# Patient Record
Sex: Female | Born: 1975 | Race: White | Hispanic: No | Marital: Married | Smoking: Never smoker
Health system: Southern US, Community
[De-identification: ages and names within clinical notes are randomized; demographics above are authoritative.]

## PROBLEM LIST (undated history)

## (undated) DIAGNOSIS — G2581 Restless legs syndrome: Secondary | ICD-10-CM

## (undated) DIAGNOSIS — E039 Hypothyroidism, unspecified: Secondary | ICD-10-CM

## (undated) DIAGNOSIS — O139 Gestational [pregnancy-induced] hypertension without significant proteinuria, unspecified trimester: Secondary | ICD-10-CM

## (undated) HISTORY — PX: OTHER SURGICAL HISTORY: SHX169

---

## 1999-12-30 ENCOUNTER — Other Ambulatory Visit: Admission: RE | Admit: 1999-12-30 | Discharge: 1999-12-30 | Payer: Self-pay | Admitting: Obstetrics and Gynecology

## 2001-08-08 ENCOUNTER — Other Ambulatory Visit: Admission: RE | Admit: 2001-08-08 | Discharge: 2001-08-08 | Payer: Self-pay | Admitting: Obstetrics and Gynecology

## 2002-04-20 ENCOUNTER — Inpatient Hospital Stay (HOSPITAL_COMMUNITY): Admission: AD | Admit: 2002-04-20 | Discharge: 2002-04-24 | Payer: Self-pay | Admitting: Obstetrics and Gynecology

## 2002-05-26 ENCOUNTER — Other Ambulatory Visit: Admission: RE | Admit: 2002-05-26 | Discharge: 2002-05-26 | Payer: Self-pay | Admitting: Obstetrics and Gynecology

## 2003-08-19 ENCOUNTER — Other Ambulatory Visit: Admission: RE | Admit: 2003-08-19 | Discharge: 2003-08-19 | Payer: Self-pay | Admitting: Obstetrics and Gynecology

## 2005-08-15 ENCOUNTER — Other Ambulatory Visit: Admission: RE | Admit: 2005-08-15 | Discharge: 2005-08-15 | Payer: Self-pay | Admitting: Obstetrics and Gynecology

## 2006-04-24 HISTORY — PX: DILATION AND CURETTAGE OF UTERUS: SHX78

## 2010-10-07 LAB — STREP B DNA PROBE
GBS: NEGATIVE
GBS: NEGATIVE

## 2010-10-07 LAB — ANTIBODY SCREEN: Antibody Screen: NEGATIVE

## 2010-10-07 LAB — GC/CHLAMYDIA PROBE AMP, GENITAL: Chlamydia: NEGATIVE

## 2010-10-10 LAB — ABO/RH: RH Type: POSITIVE

## 2010-10-10 LAB — HEPATITIS B SURFACE ANTIGEN: Hepatitis B Surface Ag: NEGATIVE

## 2010-11-17 ENCOUNTER — Other Ambulatory Visit (HOSPITAL_COMMUNITY): Payer: Self-pay | Admitting: Obstetrics and Gynecology

## 2010-11-17 DIAGNOSIS — O2690 Pregnancy related conditions, unspecified, unspecified trimester: Secondary | ICD-10-CM

## 2010-12-08 ENCOUNTER — Encounter (HOSPITAL_COMMUNITY): Payer: Self-pay

## 2010-12-08 ENCOUNTER — Ambulatory Visit (HOSPITAL_COMMUNITY)
Admission: RE | Admit: 2010-12-08 | Discharge: 2010-12-08 | Disposition: A | Discharge status: Home / Self Care | Payer: 59 | Source: Ambulatory Visit | Attending: Obstetrics and Gynecology | Admitting: Obstetrics and Gynecology

## 2010-12-08 VITALS — BP 129/68 | HR 98 | Wt 148.0 lb

## 2010-12-08 DIAGNOSIS — O358XX0 Maternal care for other (suspected) fetal abnormality and damage, not applicable or unspecified: Secondary | ICD-10-CM | POA: Insufficient documentation

## 2010-12-08 DIAGNOSIS — O09299 Supervision of pregnancy with other poor reproductive or obstetric history, unspecified trimester: Secondary | ICD-10-CM | POA: Insufficient documentation

## 2010-12-08 DIAGNOSIS — O2690 Pregnancy related conditions, unspecified, unspecified trimester: Secondary | ICD-10-CM

## 2010-12-08 DIAGNOSIS — Z363 Encounter for antenatal screening for malformations: Secondary | ICD-10-CM | POA: Insufficient documentation

## 2010-12-08 DIAGNOSIS — Z1389 Encounter for screening for other disorder: Secondary | ICD-10-CM | POA: Insufficient documentation

## 2010-12-08 NOTE — Progress Notes (Signed)
Vital signs reviewed; see ultrasound report 

## 2011-04-25 NOTE — L&D Delivery Note (Signed)
Delivery Note At  a viable female by svd  (Presentation  vtx oa ;  ).  APGAR 8/9: , ; weight .   Placenta status: intact with 43 vessel cord:  with the following complications: . none  Anesthesia: Epidural  Episiotomy: none Lacerations: none Suture Repair: na Est. Blood Loss (mL):   Mom to postpartum.  Baby to nursery-stable.  Eragon Hammond S 05/04/2011, 2:50 AM

## 2011-05-02 ENCOUNTER — Inpatient Hospital Stay (HOSPITAL_COMMUNITY)
Admission: AD | Admit: 2011-05-02 | Discharge: 2011-05-05 | DRG: 774 | Disposition: A | Discharge status: Home / Self Care | Payer: 59 | Source: Ambulatory Visit | Attending: Obstetrics and Gynecology | Admitting: Obstetrics and Gynecology

## 2011-05-02 ENCOUNTER — Inpatient Hospital Stay (HOSPITAL_COMMUNITY): Payer: 59

## 2011-05-02 ENCOUNTER — Encounter (HOSPITAL_COMMUNITY): Payer: Self-pay | Admitting: *Deleted

## 2011-05-02 DIAGNOSIS — IMO0002 Reserved for concepts with insufficient information to code with codable children: Principal | ICD-10-CM | POA: Diagnosis present

## 2011-05-02 HISTORY — DX: Hypothyroidism, unspecified: E03.9

## 2011-05-02 HISTORY — DX: Gestational (pregnancy-induced) hypertension without significant proteinuria, unspecified trimester: O13.9

## 2011-05-02 LAB — COMPREHENSIVE METABOLIC PANEL
Alkaline Phosphatase: 265 U/L — ABNORMAL HIGH (ref 39–117)
BUN: 11 mg/dL (ref 6–23)
CO2: 26 mEq/L (ref 19–32)
GFR calc Af Amer: >90 mL/min (ref >90)
GFR calc non Af Amer: >90 mL/min (ref >90)
Glucose, Bld: 95 mg/dL (ref 70–99)
Potassium: 4.2 mEq/L (ref 3.5–5.1)
Total Bilirubin: 0.3 mg/dL (ref 0.3–1.2)
Total Protein: 6.3 g/dL (ref 6.0–8.3)

## 2011-05-02 LAB — CBC
HCT: 37 % (ref 36.0–46.0)
MCV: 89.6 fL (ref 78.0–100.0)
RDW: 13.5 % (ref 11.5–15.5)
WBC: 12.3 10*3/uL — ABNORMAL HIGH (ref 4.0–10.5)

## 2011-05-02 LAB — URIC ACID: Uric Acid, Serum: 3.1 mg/dL (ref 2.4–7.0)

## 2011-05-02 MED ORDER — LACTATED RINGERS IV SOLN
INTRAVENOUS | Status: DC
  Administered 2011-05-02: 23:00:00 via INTRAVENOUS
  Administered 2011-05-03: 300 mL via INTRAVENOUS
  Administered 2011-05-03 – 2011-05-04 (×3): via INTRAVENOUS

## 2011-05-02 MED ORDER — BUTORPHANOL TARTRATE 2 MG/ML IJ SOLN: 1.0000 mg | Freq: Once | INTRAMUSCULAR | Status: AC | PRN

## 2011-05-02 MED ORDER — ACETAMINOPHEN 325 MG PO TABS: 650.0000 mg | ORAL_TABLET | ORAL | Status: DC | PRN

## 2011-05-02 MED ORDER — MISOPROSTOL 25 MCG QUARTER TABLET
25.0000 ug | ORAL_TABLET | ORAL | Status: DC | PRN
  Administered 2011-05-02 (×2): 25 ug via VAGINAL
  Filled 2011-05-02 (×2): qty 0.25

## 2011-05-02 MED ORDER — OXYCODONE-ACETAMINOPHEN 5-325 MG PO TABS: 2.0000 | ORAL_TABLET | ORAL | Status: DC | PRN

## 2011-05-02 MED ORDER — LACTATED RINGERS IV SOLN
500.0000 mL | INTRAVENOUS | Status: DC | PRN
  Administered 2011-05-03: 1000 mL via INTRAVENOUS

## 2011-05-02 MED ORDER — FLEET ENEMA 7-19 GM/118ML RE ENEM: 1.0000 | ENEMA | RECTAL | Status: DC | PRN

## 2011-05-02 MED ORDER — OXYTOCIN BOLUS FROM INFUSION
500.0000 mL | Freq: Once | INTRAVENOUS | Status: DC
  Filled 2011-05-02: qty 500

## 2011-05-02 MED ORDER — TERBUTALINE SULFATE 1 MG/ML IJ SOLN: 0.2500 mg | Freq: Once | INTRAMUSCULAR | Status: AC | PRN

## 2011-05-02 MED ORDER — LIDOCAINE HCL (PF) 1 % IJ SOLN: 30.0000 mL | INTRAMUSCULAR | Status: DC | PRN

## 2011-05-02 MED ORDER — PROMETHAZINE HCL 25 MG/ML IJ SOLN: 12.5000 mg | Freq: Once | INTRAMUSCULAR | Status: AC | PRN

## 2011-05-02 MED ORDER — OXYTOCIN 20 UNITS IN LACTATED RINGERS INFUSION - SIMPLE
1.0000 m[IU]/min | INTRAVENOUS | Status: DC
  Administered 2011-05-03: 2 m[IU]/min via INTRAVENOUS
  Administered 2011-05-04: 22 m[IU]/min via INTRAVENOUS
  Filled 2011-05-02 (×2): qty 1000

## 2011-05-02 MED ORDER — ZOLPIDEM TARTRATE 10 MG PO TABS: 10.0000 mg | ORAL_TABLET | Freq: Every evening | ORAL | Status: DC | PRN

## 2011-05-02 MED ORDER — ONDANSETRON HCL 4 MG/2ML IJ SOLN
4.0000 mg | Freq: Four times a day (QID) | INTRAMUSCULAR | Status: DC | PRN
  Administered 2011-05-03: 4 mg via INTRAVENOUS
  Filled 2011-05-02: qty 2

## 2011-05-02 MED ORDER — CITRIC ACID-SODIUM CITRATE 334-500 MG/5ML PO SOLN: 30.0000 mL | ORAL | Status: DC | PRN

## 2011-05-02 MED ORDER — IBUPROFEN 600 MG PO TABS: 600.0000 mg | ORAL_TABLET | Freq: Four times a day (QID) | ORAL | Status: DC | PRN

## 2011-05-02 NOTE — H&P (Signed)
Beverly Chen, KOT                ACCOUNT NO.:  [id number]  MEDICAL RECORD NO.:  [id number]  LOCATION:  9165                          FACILITY:  [facility]  PHYSICIAN:  Duke Salvia. Marcelle Overlie, M.D.DATE OF BIRTH:  January 31, 1976  DATE OF ADMISSION:  05/02/2011 DATE OF DISCHARGE:                             HISTORY & PHYSICAL   CHIEF COMPLAINT:  Preeclampsia at term.  HISTORY OF PRESENT ILLNESS:  A 36 year old G59, P1-0-1-1, EDD is May 16, 2011, EGA is 38 weeks.  This patient was seen yesterday by Dr. Arelia Sneddon and office with a BP of 152/70, 2+ lower extremity edema.  Cervix was 1 cm, 50% vertex.  NST was reactive with TPPA/I.  He scheduled induction for preeclampsia.  Her GBS was negative.  OBSTETRICAL HISTORY:  Significant for prior pregnancy with __________, she had a D and E at [facility] for termination of that pregnancy.  Her first trimester screen returned normal.  PAST MEDICAL HISTORY:  Please see the [facility] form for detail.  PHYSICAL EXAMINATION:  VITAL SIGNS:  Temp 98.3, blood pressure 153/70. HEENT:  Unremarkable. NECK:  Supple without masses. LUNGS:  Clear. CARDIOVASCULAR:  Regular rate and rhythm without murmurs, rubs, or gallops. BREASTS:  Not examined. ABDOMEN:  Term fundal height.  Fetal heart rate 140.  Cervix was 1, 30% vertex.  Membranes intact. EXTREMITIES:  Edema 3+.  Reflexes 1-2+.  No clonus.  IMPRESSION: 1. A 38-week intrauterine pregnancy. 2. Mild pregnancy-induced hypertension, GBS negative.  PLAN:  Dual stage labor induction per  Dr. Arelia Sneddon.  This procedure including use of Cytotec discussed with her, which she understands and accepts.     Laren Whaling M. Marcelle Overlie, M.D.     RM[facility]/MEDQ  D:  05/02/2011  T:  05/02/2011  Job:  [id number]

## 2011-05-02 NOTE — H&P (Signed)
Beverly Chen  DICTATION # [id number] CSN# [id number]   Meriel Pica, MD 05/02/2011 6:45 PM

## 2011-05-03 ENCOUNTER — Encounter (HOSPITAL_COMMUNITY): Payer: Self-pay | Admitting: Anesthesiology

## 2011-05-03 ENCOUNTER — Inpatient Hospital Stay (HOSPITAL_COMMUNITY): Payer: 59 | Admitting: Anesthesiology

## 2011-05-03 LAB — RUBELLA ANTIBODY, IGM: Rubella: IMMUNE

## 2011-05-03 LAB — CULTURE, BETA STREP (GROUP B ONLY): Organism ID, Bacteria: NEGATIVE

## 2011-05-03 MED ORDER — LIDOCAINE HCL 1.5 % IJ SOLN
INTRAMUSCULAR | Status: DC | PRN
  Administered 2011-05-03: 4 mL via EPIDURAL
  Administered 2011-05-03: 5 mL via EPIDURAL

## 2011-05-03 MED ORDER — EPHEDRINE 5 MG/ML INJ
10.0000 mg | INTRAVENOUS | Status: DC | PRN
  Filled 2011-05-03: qty 4

## 2011-05-03 MED ORDER — NALBUPHINE SYRINGE 5 MG/0.5 ML
10.0000 mg | INJECTION | Freq: Once | INTRAMUSCULAR | Status: AC
  Administered 2011-05-03: 10 mg via INTRAVENOUS
  Filled 2011-05-03: qty 1

## 2011-05-03 MED ORDER — DIPHENHYDRAMINE HCL 50 MG/ML IJ SOLN: 12.5000 mg | INTRAMUSCULAR | Status: DC | PRN

## 2011-05-03 MED ORDER — EPHEDRINE 5 MG/ML INJ: 10.0000 mg | INTRAVENOUS | Status: DC | PRN

## 2011-05-03 MED ORDER — LACTATED RINGERS IV SOLN: 500.0000 mL | Freq: Once | INTRAVENOUS | Status: DC

## 2011-05-03 MED ORDER — PHENYLEPHRINE 40 MCG/ML (10ML) SYRINGE FOR IV PUSH (FOR BLOOD PRESSURE SUPPORT): 80.0000 ug | PREFILLED_SYRINGE | INTRAVENOUS | Status: DC | PRN

## 2011-05-03 MED ORDER — FENTANYL 2.5 MCG/ML BUPIVACAINE 1/10 % EPIDURAL INFUSION (WH - ANES)
14.0000 mL/h | INTRAMUSCULAR | Status: DC
  Administered 2011-05-03 – 2011-05-04 (×2): 14 mL/h via EPIDURAL
  Filled 2011-05-03 (×3): qty 60

## 2011-05-03 MED ORDER — PHENYLEPHRINE 40 MCG/ML (10ML) SYRINGE FOR IV PUSH (FOR BLOOD PRESSURE SUPPORT)
80.0000 ug | PREFILLED_SYRINGE | INTRAVENOUS | Status: DC | PRN
  Filled 2011-05-03: qty 5

## 2011-05-03 MED ORDER — LEVOTHYROXINE SODIUM 75 MCG PO TABS
75.0000 ug | ORAL_TABLET | Freq: Every day | ORAL | Status: DC
  Filled 2011-05-03: qty 1

## 2011-05-03 MED ORDER — FENTANYL 2.5 MCG/ML BUPIVACAINE 1/10 % EPIDURAL INFUSION (WH - ANES)
INTRAMUSCULAR | Status: DC | PRN
  Administered 2011-05-03: 14 mL/h via EPIDURAL

## 2011-05-03 NOTE — Anesthesia Procedure Notes (Signed)
Epidural Patient location during procedure: OB Start time: 05/03/2011 6:35 PM  Staffing Anesthesiologist: Karmon Andis A. Performed by: anesthesiologist   Preanesthetic Checklist Completed: patient identified, site marked, surgical consent, pre-op evaluation, timeout performed, IV checked, risks and benefits discussed and monitors and equipment checked  Epidural Patient position: sitting Prep: site prepped and draped and DuraPrep Patient monitoring: continuous pulse ox and blood pressure Approach: midline Injection technique: LOR air  Needle:  Needle type: Tuohy  Needle gauge: 17 G Needle length: 9 cm Needle insertion depth: 5 cm cm Catheter type: closed end flexible Catheter size: 19 Gauge Catheter at skin depth: 10 cm Test dose: negative and 1.5% lidocaine  Assessment Events: blood not aspirated, injection not painful, no injection resistance, negative IV test and no paresthesia  Additional Notes Patient is more comfortable after epidural dosed. Please see RN's note for documentation of vital signs and FHR which are stable.

## 2011-05-03 NOTE — Progress Notes (Signed)
Patient ID: Beverly Chen, female   DOB: Sep 10, 1975, 36 y.o.   MRN: [id number] Epidural placed cx 3+ 50 % vtx -1 fhr 140's no decels Placed iupc Will adjust pitocin  With iupc

## 2011-05-03 NOTE — Anesthesia Preprocedure Evaluation (Addendum)
Anesthesia Evaluation  Patient identified by MRN, date of birth, ID band Patient awake    Reviewed: Allergy & Precautions, H&P , Patient's Chart, lab work & pertinent test results  Airway Mallampati: II TM Distance: >3 FB Neck ROM: full    Dental No notable dental hx. (+) Teeth Intact   Pulmonary neg pulmonary ROS,  clear to auscultation  Pulmonary exam normal       Cardiovascular hypertension, neg cardio ROS regular Normal PIH   Neuro/Psych Negative Neurological ROS  Negative Psych ROS   GI/Hepatic negative GI ROS, Neg liver ROS,   Endo/Other  Hypothyroidism   Renal/GU negative Renal ROS  Genitourinary negative   Musculoskeletal   Abdominal Normal abdominal exam  (+)   Peds  Hematology negative hematology ROS (+)   Anesthesia Other Findings   Reproductive/Obstetrics (+) Pregnancy                          Anesthesia Physical Anesthesia Plan  ASA: II  Anesthesia Plan: Epidural   Post-op Pain Management:    Induction:   Airway Management Planned:   Additional Equipment:   Intra-op Plan:   Post-operative Plan:   Informed Consent: I have reviewed the patients History and Physical, chart, labs and discussed the procedure including the risks, benefits and alternatives for the proposed anesthesia with the patient or authorized representative who has indicated his/her understanding and acceptance.     Plan Discussed with: Anesthesiologist and Surgeon  Anesthesia Plan Comments:         Anesthesia Quick Evaluation

## 2011-05-03 NOTE — Progress Notes (Signed)
Patient ID: Beverly Chen, female   DOB: 1975/09/11, 36 y.o.   MRN: [id number] IUP at 38 admitted with pih cytotec lst pm bp 114/70 vss cx 2 50 % vtx -1 fhr reactive no decel Continue pitocin risks discussed

## 2011-05-03 NOTE — Progress Notes (Signed)
Patient ID: Beverly Chen, female   DOB: Sep 04, 1975, 36 y.o.   MRN: [id number] cx 2 + arom clear pitocin 10 mu fhr reactive no decel

## 2011-05-04 ENCOUNTER — Encounter (HOSPITAL_COMMUNITY): Payer: Self-pay | Admitting: *Deleted

## 2011-05-04 ENCOUNTER — Other Ambulatory Visit: Payer: Self-pay | Admitting: Obstetrics and Gynecology

## 2011-05-04 MED ORDER — DIPHENHYDRAMINE HCL 25 MG PO CAPS: 25.0000 mg | ORAL_CAPSULE | Freq: Four times a day (QID) | ORAL | Status: DC | PRN

## 2011-05-04 MED ORDER — BENZOCAINE-MENTHOL 20-0.5 % EX AERO: 1.0000 "application " | INHALATION_SPRAY | CUTANEOUS | Status: DC | PRN

## 2011-05-04 MED ORDER — SENNOSIDES-DOCUSATE SODIUM 8.6-50 MG PO TABS: 2.0000 | ORAL_TABLET | Freq: Every day | ORAL | Status: DC

## 2011-05-04 MED ORDER — LEVOTHYROXINE SODIUM 75 MCG PO TABS
75.0000 ug | ORAL_TABLET | Freq: Every day | ORAL | Status: DC
  Administered 2011-05-04 – 2011-05-05 (×2): 75 ug via ORAL
  Filled 2011-05-04 (×2): qty 1

## 2011-05-04 MED ORDER — TETANUS-DIPHTH-ACELL PERTUSSIS 5-2.5-18.5 LF-MCG/0.5 IM SUSP: 0.5000 mL | Freq: Once | INTRAMUSCULAR | Status: DC

## 2011-05-04 MED ORDER — LANOLIN HYDROUS EX OINT: TOPICAL_OINTMENT | CUTANEOUS | Status: DC | PRN

## 2011-05-04 MED ORDER — SIMETHICONE 80 MG PO CHEW: 80.0000 mg | CHEWABLE_TABLET | ORAL | Status: DC | PRN

## 2011-05-04 MED ORDER — IBUPROFEN 100 MG/5ML PO SUSP
600.0000 mg | Freq: Four times a day (QID) | ORAL | Status: DC
  Administered 2011-05-04 – 2011-05-05 (×3): 600 mg via ORAL
  Filled 2011-05-04 (×6): qty 30

## 2011-05-04 MED ORDER — FLEET ENEMA 7-19 GM/118ML RE ENEM: 1.0000 | ENEMA | Freq: Every day | RECTAL | Status: DC | PRN

## 2011-05-04 MED ORDER — ZOLPIDEM TARTRATE 5 MG PO TABS: 5.0000 mg | ORAL_TABLET | Freq: Every evening | ORAL | Status: DC | PRN

## 2011-05-04 MED ORDER — DIBUCAINE 1 % RE OINT: 1.0000 "application " | TOPICAL_OINTMENT | RECTAL | Status: DC | PRN

## 2011-05-04 MED ORDER — WITCH HAZEL-GLYCERIN EX PADS: 1.0000 "application " | MEDICATED_PAD | CUTANEOUS | Status: DC | PRN

## 2011-05-04 MED ORDER — PRENATAL MULTIVITAMIN CH: 1.0000 | ORAL_TABLET | Freq: Every day | ORAL | Status: DC

## 2011-05-04 MED ORDER — BISACODYL 10 MG RE SUPP: 10.0000 mg | Freq: Every day | RECTAL | Status: DC | PRN

## 2011-05-04 MED ORDER — OXYCODONE-ACETAMINOPHEN 5-325 MG PO TABS: 1.0000 | ORAL_TABLET | ORAL | Status: DC | PRN

## 2011-05-04 MED ORDER — ONDANSETRON HCL 4 MG/2ML IJ SOLN: 4.0000 mg | INTRAMUSCULAR | Status: DC | PRN

## 2011-05-04 MED ORDER — COMPLETENATE 29-1 MG PO CHEW
1.0000 | CHEWABLE_TABLET | Freq: Every day | ORAL | Status: DC
  Administered 2011-05-04: 1 via ORAL
  Filled 2011-05-04 (×3): qty 1

## 2011-05-04 MED ORDER — ONDANSETRON HCL 4 MG PO TABS: 4.0000 mg | ORAL_TABLET | ORAL | Status: DC | PRN

## 2011-05-04 NOTE — Progress Notes (Signed)
Post Partum Day 0 Subjective: no complaints, up ad lib, voiding and tolerating PO  Objective: Blood pressure 122/66, pulse 80, temperature 98.6 F (37 C), temperature source Oral, resp. rate 18, weight 67.132 kg (148 lb), last menstrual period 08/09/2010, SpO2 97.00%, unknown if currently breastfeeding.  Physical Exam:  General: alert and cooperative Lochia: appropriate Uterine Fundus: firm Perineum intact DVT Evaluation: No evidence of DVT seen on physical exam.   Basename 05/02/11 1850  HGB 12.8  HCT 37.0    Assessment/Plan: Plan for discharge tomorrow   LOS: 2 days   Beverly Chen G 05/04/2011, 8:20 AM

## 2011-05-04 NOTE — Anesthesia Postprocedure Evaluation (Signed)
  Anesthesia Post-op Note  Patient: Beverly Chen  Procedure(s) Performed: * No procedures listed *  Patient Location: PACU and Mother/Baby  Anesthesia Type: Epidural  Level of Consciousness: awake, alert  and oriented  Airway and Oxygen Therapy: Patient Spontanous Breathing  Post-op Pain: none  Post-op Assessment: Post-op Vital signs reviewed and Patient's Cardiovascular Status Stable  Post-op Vital Signs: Reviewed and stable  Complications: No apparent anesthesia complications

## 2011-05-04 NOTE — Progress Notes (Signed)
SW consult received for "babies who have drug screen sent." SW reviewed babies chart and there has not been any drug screens ordered.  Therefore, SW has screened out this referral as an error.  SW will only see if appropriate consult is ordered. 

## 2011-05-05 LAB — CBC
MCH: 30.2 pg (ref 26.0–34.0)
MCHC: 33.1 g/dL (ref 30.0–36.0)
MCV: 91.2 fL (ref 78.0–100.0)
Platelets: 248 10*3/uL (ref 150–400)
RBC: 3.98 MIL/uL (ref 3.87–5.11)
RDW: 13.9 % (ref 11.5–15.5)

## 2011-05-05 NOTE — Progress Notes (Signed)
Post Partum Day 1 Subjective: no complaints, up ad lib, voiding, tolerating PO and + flatus  Objective: Blood pressure 131/82, pulse 62, temperature 97.7 F (36.5 C), temperature source Oral, resp. rate 20, weight 67.132 kg (148 lb), last menstrual period 08/09/2010, SpO2 97.00%, unknown if currently breastfeeding.  Physical Exam:  General: alert and cooperative Lochia: appropriate Uterine Fundus: firm Perineum intact DVT Evaluation: No evidence of DVT seen on physical exam.   Basename 05/05/11 0555 05/02/11 1850  HGB 12.0 12.8  HCT 36.3 37.0    Assessment/Plan: Discharge home   LOS: 3 days   Thomas Mabry G 05/05/2011, 8:03 AM

## 2011-05-05 NOTE — Discharge Summary (Signed)
Obstetric Discharge Summary Reason for Admission: induction of labor Prenatal Procedures: ultrasound Intrapartum Procedures: spontaneous vaginal delivery Postpartum Procedures: none Complications-Operative and Postpartum: none Hemoglobin  Date Value Range Status  05/05/2011 12.0  12.0-15.0 (g/dL) Final     HCT  Date Value Range Status  05/05/2011 36.3  36.0-46.0 (%) Final    Discharge Diagnoses: Term Pregnancy-delivered  Discharge Information: Date: 05/05/2011 Activity: pelvic rest Diet: routine Medications: PNV and synthroid Condition: stable Instructions: refer to practice specific booklet Discharge to: home   Newborn Data: Live born female  Birth Weight: 7 lb 1 oz (3204 g) APGAR: , 10  Home with mother.  Beverly Chen G 05/05/2011, 8:09 AM

## 2011-06-07 ENCOUNTER — Encounter (HOSPITAL_COMMUNITY): Payer: Self-pay | Admitting: Pharmacist

## 2011-06-12 NOTE — H&P (Signed)
  Patient name  Beverly Chen, Older DICTATION#  [id number] CSN# [id number]  [name], MD 06/12/2011 2:00 PM

## 2011-06-13 ENCOUNTER — Encounter (HOSPITAL_COMMUNITY): Payer: Self-pay

## 2011-06-13 ENCOUNTER — Encounter (HOSPITAL_COMMUNITY)
Admission: RE | Admit: 2011-06-13 | Discharge: 2011-06-13 | Disposition: A | Discharge status: Home / Self Care | Payer: 59 | Source: Ambulatory Visit | Attending: Obstetrics and Gynecology | Admitting: Obstetrics and Gynecology

## 2011-06-13 HISTORY — DX: Restless legs syndrome: G25.81

## 2011-06-13 LAB — CBC
HCT: 40.5 % (ref 36.0–46.0)
MCV: 88.4 fL (ref 78.0–100.0)
Platelets: 362 10*3/uL (ref 150–400)
RBC: 4.58 MIL/uL (ref 3.87–5.11)
WBC: 7.4 10*3/uL (ref 4.0–10.5)

## 2011-06-13 NOTE — H&P (Signed)
NAMECHEMERE, STEFFLER NO.:  [id number]  MEDICAL RECORD NO.:  [id number]  LOCATION:  [facility]IO                         FACILITY:  [facility]  PHYSICIAN:  [name], M.D.   DATE OF BIRTH:  1975/05/16  DATE OF ADMISSION:  06/07/2011 DATE OF DISCHARGE:                             HISTORY & PHYSICAL   The patient is a 36 year old, gravida 3, para 2 female, presents for laparoscopic bilateral tubal ligation.  The patient desires a permanent sterilization.  Alternative forms of birth control have been discussed.  The potential irreversibility of sterilization is explained.  A failure rate of 1 in 200 is quoted. Failures can be in the form of ectopic pregnancy requiring further surgical management.  ALLERGIES:  She has no known drug allergies.  MEDICATIONS:  She is on 75 mcg of Synthroid.  PAST MEDICAL HISTORY:  There is a history of hypothyroidism, on medications as noted.  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  PAST SURGICAL HISTORY:  She has had a D and E.  She has had 2 vaginal deliveries.  SOCIAL HISTORY:  No tobacco, alcohol use.  FAMILY HISTORY:  History of diabetes in her mother and father.  Mother also has a history of chronic hypertension.  Paternal grandfather and paternal grandmother had a history of a stroke.  REVIEW OF SYSTEMS:  Noncontributory.  PHYSICAL EXAM:  GENERAL AND VITAL SIGNS:  The patient is afebrile, stable vital signs. HEENT:  The patient is normocephalic.  Pupil equal, round, and reactive to light and accommodation.  Extraocular movements were intact.  Sclerae and conjunctivae were clear.  Oropharynx clear. NECK:  Without thyromegaly. BREASTS:  Not examined. LUNGS:  Clear. CARDIOVASCULAR:  Regular rhythm and rate.  There were no murmurs or gallops. ABDOMEN:  Benign.  No mass, organomegaly, or tenderness. PELVIC:  Normal external genitalia.  Vaginal mucosa is clear.  Cervix unremarkable.  Uterus normal  size, shape, and contour.  Adnexa free of mass or tenderness. EXTREMITIES:  Trace edema. NEUROLOGIC:  Grossly within normal limits.  IMPRESSION: 1. Multiparity, desires sterility. 2. Hypothyroidism.  PLAN:  The patient to undergo laparoscopic bilateral tubal ligation. Risks of surgery have been discussed including the risk of infection. The risk of hemorrhage that could require transfusion with the risk of AIDS, or hepatitis.  Risk of injury to adjacent organs including bladder, bowel that could require further exploratory surgery.  Risk of deep venous thrombosis and pulmonary embolus.  Again alternatives for birth control have been discussed.  Potential irreversibility of sterilization is explained and failure rate is discussed.     [name], M.D.     JSM/MEDQ  D:  06/12/2011  T:  06/13/2011  Job:  [id number]

## 2011-06-13 NOTE — Pre-Procedure Instructions (Signed)
OK to see patient DOS 

## 2011-06-13 NOTE — Patient Instructions (Addendum)
   Your procedure is scheduled on: Friday, Feb22nd  Enter through the Main Entrance of [facility] at: 6am Pick up the phone at the desk and dial [phone] and inform us of your arrival.  Please call this number if you have any problems the morning of surgery: [phone]  Remember: Do not eat food after midnight: Thursday Do not drink clear liquids after: Thursday Take these medicines the morning of surgery with a SIP OF WATER: Synthroid  Do not wear jewelry, make-up, or FINGER nail polish Do not wear lotions, powders, perfumes or deodorant. Do not shave 48 hours prior to surgery. Do not bring valuables to the hospital.   Patients discharged on the day of surgery will not be allowed to drive home.   Home with Husband Lynell Greenhouse  cell [phone]   Remember to use your hibiclens as instructed.Please shower with 1/2 bottle the evening before your surgery and the other 1/2 bottle the morning of surgery.

## 2011-06-16 ENCOUNTER — Encounter (HOSPITAL_COMMUNITY): Payer: Self-pay | Admitting: *Deleted

## 2011-06-16 ENCOUNTER — Encounter (HOSPITAL_COMMUNITY): Payer: Self-pay | Admitting: Anesthesiology

## 2011-06-16 ENCOUNTER — Encounter (HOSPITAL_COMMUNITY)
Admission: RE | Disposition: A | Discharge status: Home Health | Payer: Self-pay | Source: Ambulatory Visit | Attending: Obstetrics and Gynecology

## 2011-06-16 ENCOUNTER — Ambulatory Visit (HOSPITAL_COMMUNITY): Payer: 59 | Admitting: Anesthesiology

## 2011-06-16 ENCOUNTER — Ambulatory Visit (HOSPITAL_COMMUNITY)
Admission: RE | Admit: 2011-06-16 | Discharge: 2011-06-16 | Disposition: A | Discharge status: Home Health | Payer: 59 | Source: Ambulatory Visit | Attending: Obstetrics and Gynecology | Admitting: Obstetrics and Gynecology

## 2011-06-16 DIAGNOSIS — Z01812 Encounter for preprocedural laboratory examination: Secondary | ICD-10-CM | POA: Insufficient documentation

## 2011-06-16 DIAGNOSIS — Z641 Problems related to multiparity: Secondary | ICD-10-CM | POA: Insufficient documentation

## 2011-06-16 DIAGNOSIS — Z302 Encounter for sterilization: Secondary | ICD-10-CM | POA: Insufficient documentation

## 2011-06-16 DIAGNOSIS — Z9851 Tubal ligation status: Secondary | ICD-10-CM

## 2011-06-16 DIAGNOSIS — E039 Hypothyroidism, unspecified: Secondary | ICD-10-CM | POA: Insufficient documentation

## 2011-06-16 HISTORY — PX: LAPAROSCOPIC TUBAL LIGATION: SHX1937

## 2011-06-16 LAB — HCG, SERUM, QUALITATIVE: Preg, Serum: NEGATIVE

## 2011-06-16 SURGERY — LIGATION, FALLOPIAN TUBE, LAPAROSCOPIC
Anesthesia: General | Site: Abdomen | Laterality: Bilateral | Wound class: Clean Contaminated

## 2011-06-16 MED ORDER — LACTATED RINGERS IV SOLN
INTRAVENOUS | Status: DC
  Administered 2011-06-16: 125 mL/h via INTRAVENOUS

## 2011-06-16 MED ORDER — FENTANYL CITRATE 0.05 MG/ML IJ SOLN
INTRAMUSCULAR | Status: DC | PRN
  Administered 2011-06-16: 50 ug via INTRAVENOUS
  Administered 2011-06-16 (×2): 100 ug via INTRAVENOUS

## 2011-06-16 MED ORDER — BUPIVACAINE HCL (PF) 0.25 % IJ SOLN
INTRAMUSCULAR | Status: DC | PRN
  Administered 2011-06-16: 30 mL

## 2011-06-16 MED ORDER — DEXAMETHASONE SODIUM PHOSPHATE 10 MG/ML IJ SOLN
INTRAMUSCULAR | Status: DC | PRN
  Administered 2011-06-16: 10 mg via INTRAVENOUS

## 2011-06-16 MED ORDER — FENTANYL CITRATE 0.05 MG/ML IJ SOLN: 25.0000 ug | INTRAMUSCULAR | Status: DC | PRN

## 2011-06-16 MED ORDER — LIDOCAINE HCL (CARDIAC) 20 MG/ML IV SOLN
INTRAVENOUS | Status: DC | PRN
  Administered 2011-06-16: 60 mg via INTRAVENOUS

## 2011-06-16 MED ORDER — PROPOFOL 10 MG/ML IV EMUL
INTRAVENOUS | Status: DC | PRN
  Administered 2011-06-16: 200 mg via INTRAVENOUS

## 2011-06-16 MED ORDER — KETOROLAC TROMETHAMINE 30 MG/ML IJ SOLN: 15.0000 mg | Freq: Once | INTRAMUSCULAR | Status: DC | PRN

## 2011-06-16 MED ORDER — MIDAZOLAM HCL 5 MG/5ML IJ SOLN
INTRAMUSCULAR | Status: DC | PRN
  Administered 2011-06-16: 2 mg via INTRAVENOUS

## 2011-06-16 MED ORDER — KETOROLAC TROMETHAMINE 30 MG/ML IJ SOLN
INTRAMUSCULAR | Status: DC | PRN
  Administered 2011-06-16: 30 mg via INTRAVENOUS

## 2011-06-16 MED ORDER — ONDANSETRON HCL 4 MG/2ML IJ SOLN
INTRAMUSCULAR | Status: DC | PRN
  Administered 2011-06-16: 4 mg via INTRAVENOUS

## 2011-06-16 MED ORDER — BUPIVACAINE HCL (PF) 0.25 % IJ SOLN
INTRAMUSCULAR | Status: AC
  Filled 2011-06-16: qty 30

## 2011-06-16 MED ORDER — ACETAMINOPHEN 325 MG PO TABS: 325.0000 mg | ORAL_TABLET | ORAL | Status: DC | PRN

## 2011-06-16 MED ORDER — LACTATED RINGERS IV SOLN: INTRAVENOUS | Status: DC

## 2011-06-16 MED ORDER — SUCCINYLCHOLINE CHLORIDE 20 MG/ML IJ SOLN
INTRAMUSCULAR | Status: DC | PRN
  Administered 2011-06-16: 120 mg via INTRAVENOUS

## 2011-06-16 MED ORDER — PROMETHAZINE HCL 25 MG/ML IJ SOLN: 6.2500 mg | INTRAMUSCULAR | Status: DC | PRN

## 2011-06-16 SURGICAL SUPPLY — 15 items
CATH ROBINSON RED A/P 16FR (CATHETERS) ×2 IMPLANT
CLOTH BEACON ORANGE TIMEOUT ST (SAFETY) ×2 IMPLANT
DRSG COVERLET 3X3 (GAUZE/BANDAGES/DRESSINGS) ×2 IMPLANT
GLOVE BIO SURGEON STRL SZ7 (GLOVE) ×4 IMPLANT
GOWN PREVENTION PLUS LG XLONG (DISPOSABLE) ×4 IMPLANT
NEEDLE INSUFFLATION 14GA 120MM (NEEDLE) IMPLANT
PACK LAPAROSCOPY BASIN (CUSTOM PROCEDURE TRAY) ×2 IMPLANT
STRIP CLOSURE SKIN 1/4X3 (GAUZE/BANDAGES/DRESSINGS) ×2 IMPLANT
SUT VIC AB 3-0 FS2 27 (SUTURE) ×2 IMPLANT
SUT VICRYL 0 UR6 27IN ABS (SUTURE) IMPLANT
TOWEL OR 17X24 6PK STRL BLUE (TOWEL DISPOSABLE) ×4 IMPLANT
TROCAR BALLN 12MMX100 BLUNT (TROCAR) IMPLANT
TROCAR Z-THREAD BLADED 11X100M (TROCAR) IMPLANT
WARMER LAPAROSCOPE (MISCELLANEOUS) ×2 IMPLANT
WATER STERILE IRR 1000ML POUR (IV SOLUTION) ×2 IMPLANT

## 2011-06-16 NOTE — H&P (Signed)
  History and physical exam unchanged 

## 2011-06-16 NOTE — Discharge Instructions (Signed)
Diagnostic Laparoscopy Laparoscopy is a surgical procedure. It is used to diagnose and treat diseases inside the belly(abdomen). It is usually a brief, common, and relatively simple procedure. The laparoscopeis a thin, lighted, pencil-sized instrument. It is like a telescope. It is inserted into your abdomen through a small cut (incision). Your caregiver can look at the organs inside your body through this instrument. He or she can see if there is anything abnormal. Laparoscopy can be done either in a hospital or outpatient clinic. You may be given a mild sedative to help you relax before the procedure. Once in the operating room, you will be given a drug to make you sleep (general anesthesia). Laparoscopy usually lasts less than 1 hour. After the procedure, you will be monitored in a recovery area until you are stable and doing well. Once you are home, it will take 2 to 3 days to fully recover. RISKS AND COMPLICATIONS  Laparoscopy has relatively few risks. Your caregiver will discuss the risks with you before the procedure. Some problems that can occur include:  Infection.   Bleeding.   Damage to other organs.   Anesthetic side effects.  PROCEDURE Once you receive anesthesia, your surgeon inflates the abdomen with a harmless gas (carbon dioxide). This makes the organs easier to see. The laparoscope is inserted into the abdomen through a small incision. This allows your surgeon to see into the abdomen. Other small instruments are also inserted into the abdomen through other small openings. Many surgeons attach a video camera to the laparoscope to enlarge the view. During a diagnostic laparoscopy, the surgeon may be looking for inflammation, infection, or cancer. Your surgeon may take tissue samples(biopsies). The samples are sent to a specialist in looking at cells and tissue samples (pathologist). The pathologist examines them under a microscope. Biopsies can help to diagnose or confirm a  disease. AFTER THE PROCEDURE   The gas is released from inside the abdomen.   The incisions are closed with stitches (sutures). Because these incisions are small (usually less than 1/2 inch), there is usually minimal discomfort after the procedure. There may be some mild discomfort in the throat. This is from the tube placed in the throat while you were sleeping. You may have some mild abdominal discomfort. There may also be discomfort from the instrument placement incisions in the abdomen.   The recovery time is shortened as long as there are no complications.   You will rest in a recovery room until stable and doing well. As long as there are no complications, you may be allowed to go home.  FINDING OUT THE RESULTS OF YOUR TEST Not all test results are available during your visit. If your test results are not back during the visit, make an appointment with your caregiver to find out the results. Do not assume everything is normal if you have not heard from your caregiver or the medical facility. It is important for you to follow up on all of your test results. HOME CARE INSTRUCTIONS   Take all medicines as directed.   Only take over-the-counter or prescription medicines for pain, discomfort, or fever as directed by your caregiver.   Resume daily activities as directed.   Showers are preferred over baths.   You may resume sexual activities in 1 week or as directed.   Do not drive while taking narcotics.  SEEK MEDICAL CARE IF:   There is increasing abdominal pain.   There is new pain in the shoulders (shoulder strap areas).     You feel lightheaded or faint.   You have the chills.   You or your child has an oral temperature above 102 F (38.9 C).   There is pus-like (purulent) drainage from any of the wounds.   You are unable to pass gas or have a bowel movement.   You feel sick to your stomach (nauseous) or throw up (vomit).  MAKE SURE YOU:   Understand these instructions.    Will watch your condition.   Will get help right away if you are not doing well or get worse.  Document Released: 07/17/2000 Document Revised: 12/21/2010 Document Reviewed: 04/10/2007 Exit[employer] Patient Information 2012 [employer]. 

## 2011-06-16 NOTE — Anesthesia Preprocedure Evaluation (Signed)
Anesthesia Evaluation  Patient identified by MRN, date of birth, ID band Patient awake    Reviewed: Allergy & Precautions, H&P , Patient's Chart, lab work & pertinent test results, reviewed documented beta blocker date and time   History of Anesthesia Complications Negative for: history of anesthetic complications  Airway Mallampati: II TM Distance: >3 FB Neck ROM: full    Dental No notable dental hx.    Pulmonary neg pulmonary ROS,  clear to auscultation  Pulmonary exam normal       Cardiovascular Exercise Tolerance: Good neg cardio ROS regular Normal    Neuro/Psych Negative Neurological ROS  Negative Psych ROS   GI/Hepatic negative GI ROS, Neg liver ROS,   Endo/Other  Negative Endocrine ROS  Renal/GU negative Renal ROS     Musculoskeletal   Abdominal   Peds  Hematology negative hematology ROS (+)   Anesthesia Other Findings Hypothyroid  RLS  Reproductive/Obstetrics negative OB ROS                           Anesthesia Physical Anesthesia Plan  ASA: II  Anesthesia Plan: General ETT   Post-op Pain Management:    Induction:   Airway Management Planned:   Additional Equipment:   Intra-op Plan:   Post-operative Plan:   Informed Consent: I have reviewed the patients History and Physical, chart, labs and discussed the procedure including the risks, benefits and alternatives for the proposed anesthesia with the patient or authorized representative who has indicated his/her understanding and acceptance.   Dental Advisory Given  Plan Discussed with: CRNA and Surgeon  Anesthesia Plan Comments:         Anesthesia Quick Evaluation

## 2011-06-16 NOTE — Transfer of Care (Signed)
Immediate Anesthesia Transfer of Care Note  Patient: Beverly Chen  Procedure(s) Performed: Procedure(s) (LRB): LAPAROSCOPIC TUBAL LIGATION (Bilateral)  Patient Location: PACU  Anesthesia Type: General  Level of Consciousness: awake, alert , oriented and patient cooperative  Airway & Oxygen Therapy: Patient Spontanous Breathing and Patient connected to nasal cannula oxygen  Post-op Assessment: Report given to PACU RN and Post -op Vital signs reviewed and stable  Post vital signs: Reviewed  Complications: No apparent anesthesia complications

## 2011-06-16 NOTE — Op Note (Signed)
Patient name  Beverly Chen, Beverly Chen DICTATION#  [id number] CSN# [id number]  [name], MD 06/16/2011 8:05 AM

## 2011-06-16 NOTE — Preoperative (Signed)
Beta Blockers   Reason not to administer Beta Blockers:Not Applicable 

## 2011-06-16 NOTE — Brief Op Note (Signed)
06/16/2011  8:04 AM  PATIENT:  Beverly Chen  36 y.o. female  PRE-OPERATIVE DIAGNOSIS:  DESIRES STERILITY  POST-OPERATIVE DIAGNOSIS:  DESIRES STERILITY  PROCEDURE:  Procedure(s) (LRB): LAPAROSCOPIC TUBAL LIGATION (Bilateral)  SURGEON:  Surgeon(s) and Role:    * [name], MD - Primary  PHYSICIAN ASSISTANT:   ASSISTANTS: none   ANESTHESIA:   general  EBL:  Total I/O In: 700 [I.V.:700] Out: -   BLOOD ADMINISTERED:none  DRAINS: none   LOCAL MEDICATIONS USED:  MARCAINE     SPECIMEN:  No Specimen and Fine Needle Aspirate  DISPOSITION OF SPECIMEN:  N/A  COUNTS:  YES  TOURNIQUET:  * No tourniquets in log *  DICTATION: .Other Dictation: Dictation Number [id number]  PLAN OF CARE: Discharge to home after PACU  PATIENT DISPOSITION:  PACU - hemodynamically stable.   Delay start of Pharmacological VTE agent (>24hrs) due to surgical blood loss or risk of bleeding: no

## 2011-06-16 NOTE — Anesthesia Postprocedure Evaluation (Signed)
  Anesthesia Post-op Note  Patient: Beverly Chen  Procedure(s) Performed: Procedure(s) (LRB): LAPAROSCOPIC TUBAL LIGATION (Bilateral) Patient is awake and responsive. Pain and nausea are reasonably well controlled. Vital signs are stable and clinically acceptable. Oxygen saturation is clinically acceptable. There are no apparent anesthetic complications at this time. Patient is ready for discharge.

## 2011-06-16 NOTE — Op Note (Signed)
NAMEGIADA, Beverly Chen NO.:  [id number]  MEDICAL RECORD NO.:  [id number]  LOCATION:  [facility]                          FACILITY:  [facility]  PHYSICIAN:  [name], M.D.   DATE OF BIRTH:  1976-03-07  DATE OF PROCEDURE:  06/16/2011 DATE OF DISCHARGE:                              OPERATIVE REPORT   PREOPERATIVE DIAGNOSIS:  Multiparity desires sterility.  POSTOPERATIVE DIAGNOSIS:  Multiparity desires sterility.  OPERATIVE PROCEDURE:  Laparoscopic bilateral tubal fulguration.  SURGEON:  [name], MD  ANESTHESIA:  General endotracheal.  ESTIMATED BLOOD LOSS:  Minimal.  PACKS AND DRAINS:  None.  INTRAOPERATIVE BLOOD PLACED:  None.  COMPLICATIONS:  None.  INDICATIONS:  Dictated in history and physical.  PROCEDURE:  The patient was taken to the OR and placed in supine position.  After satisfactory level of general anesthesia was obtained, the patient was placed in dorsal lithotomy position using the Allen stirrups.  The abdomen, perineum, and vagina were prepped out with Betadine.  Bladder was emptied by catheterization.  A Hulka tenaculum was put in place and secured.  The patient was draped in a sterile field.  Subumbilical incision made with a knife.  Veress needle was introduced into the abdominal cavity.  Abdomen was inflated to approximately 3 L carbon dioxide.  Operating laparoscope was introduced. Visualization revealed no evidence of injury to adjacent organs.  The uterus, tubes, and ovaries were unremarkable.  Midsegment of each tube was coagulated for a distance of 3 cm.  We continued coagulation until the resistance read zero on the meter, then we re-coagulated the same segment of the tube completely desiccating the tube.  During the procedure, both tubes were adequately coagulated.  There were no signs of injury to adjacent organs.  The abdomen was deflated with carbon dioxide and all trocars removed.  Subumbilical incision was closed with  interrupted subcuticulars of 4-0 Vicryl.  The Hulka tenaculum was then removed.  The patient taken out of dorsal lithotomy position.  Once alert and extubated, transferred to recovery room in good condition.  Sponge, instrument, and needle count was reported as correct by circulating nurse.     [name], M.D.     JSM/MEDQ  D:  06/16/2011  T:  06/16/2011  Job:  [id number]

## 2011-06-19 ENCOUNTER — Encounter (HOSPITAL_COMMUNITY): Payer: Self-pay | Admitting: Obstetrics and Gynecology

## 2012-05-21 IMAGING — US US OB DETAIL+14 WK
1 series · 14 of 28 positions shown · non-contrast
Comparison: none

[Series 1: us ob detail+14 wk · 0.23mm/px · 14 of 94 slices shown]
[im 4/94]
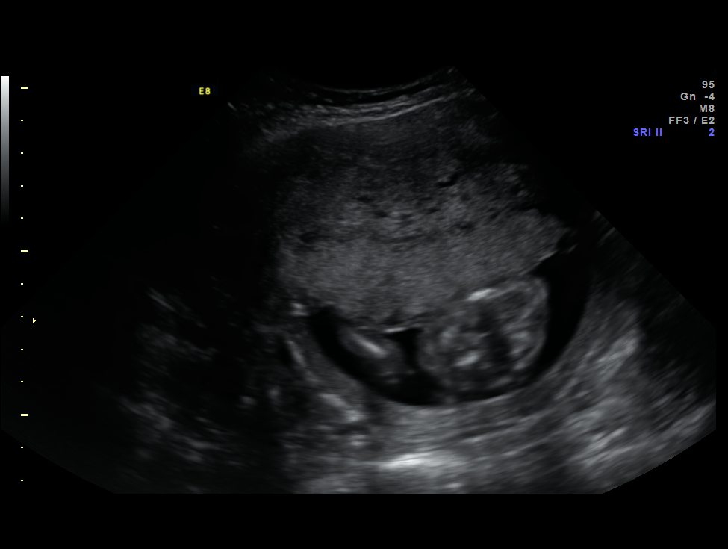
[im 11/94]
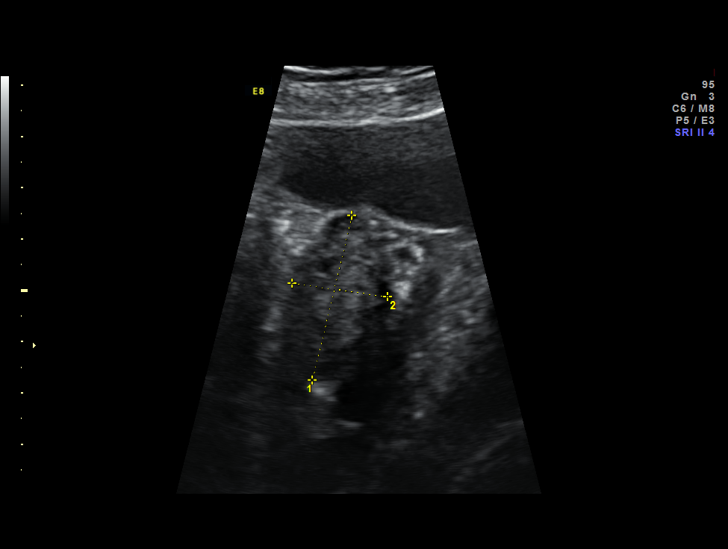
[im 18/94]
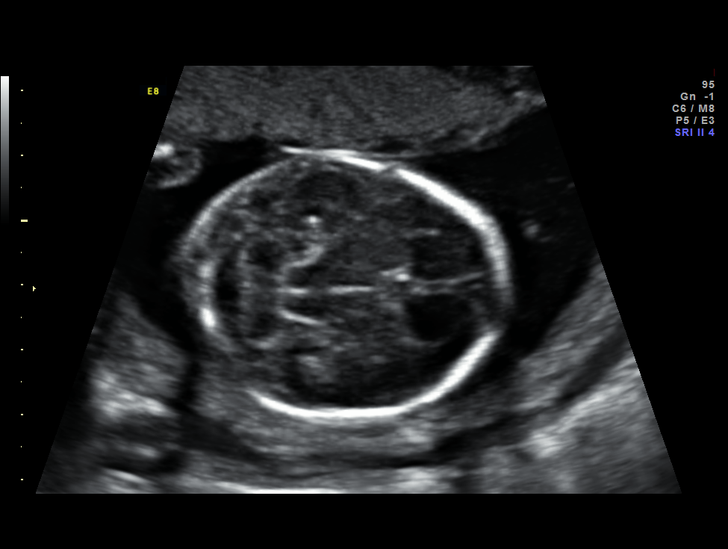
[im 25/94]
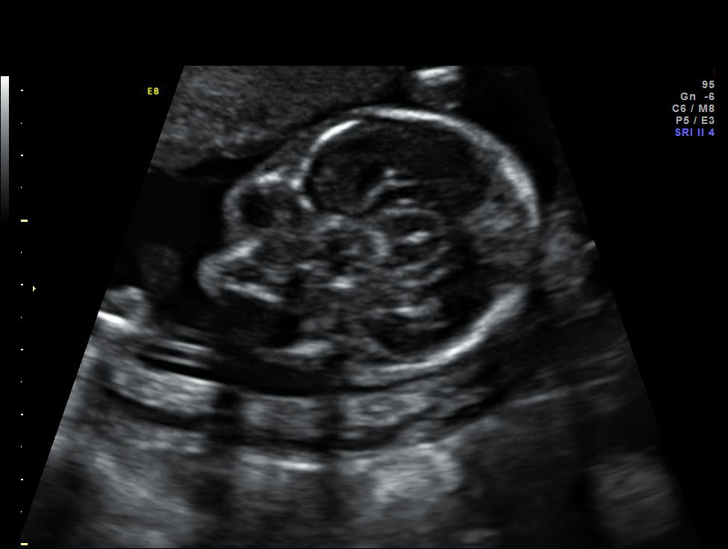
[im 32/94]
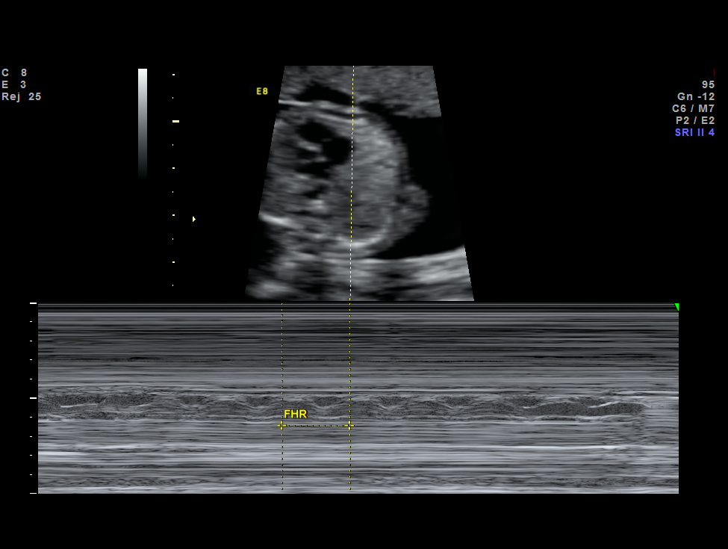
[im 38/94]
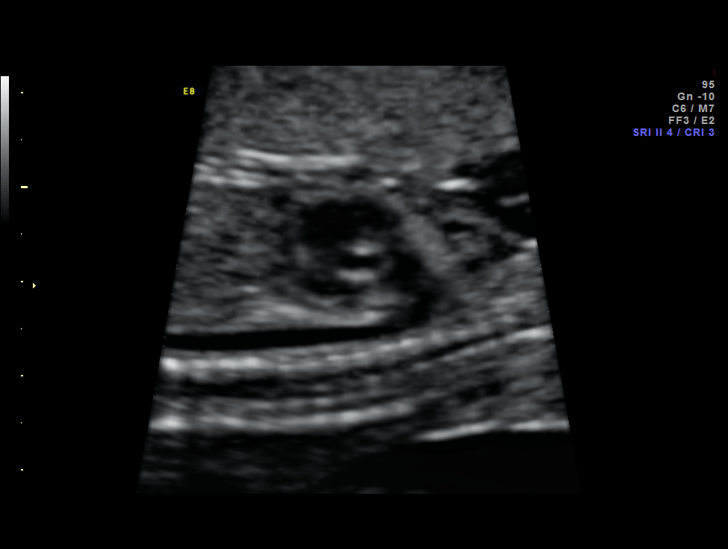
[im 45/94]
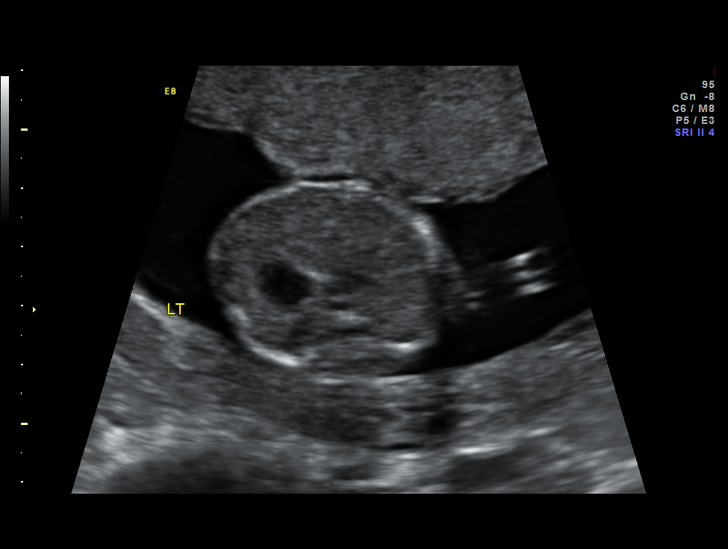
[im 52/94]
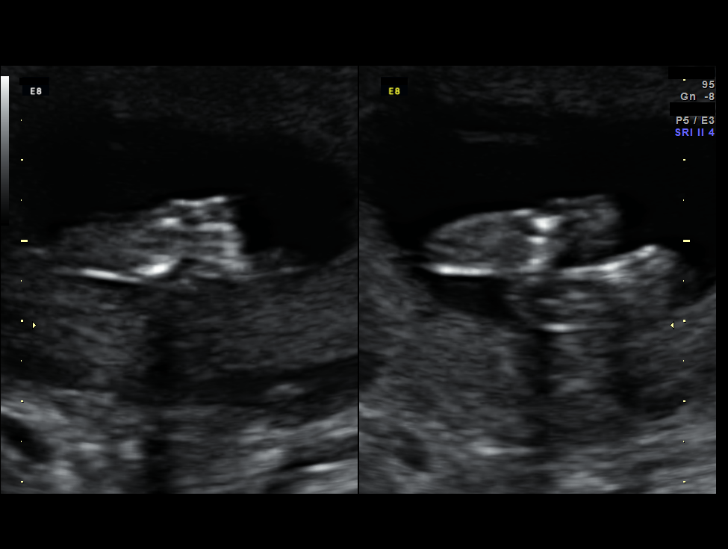
[im 59/94]
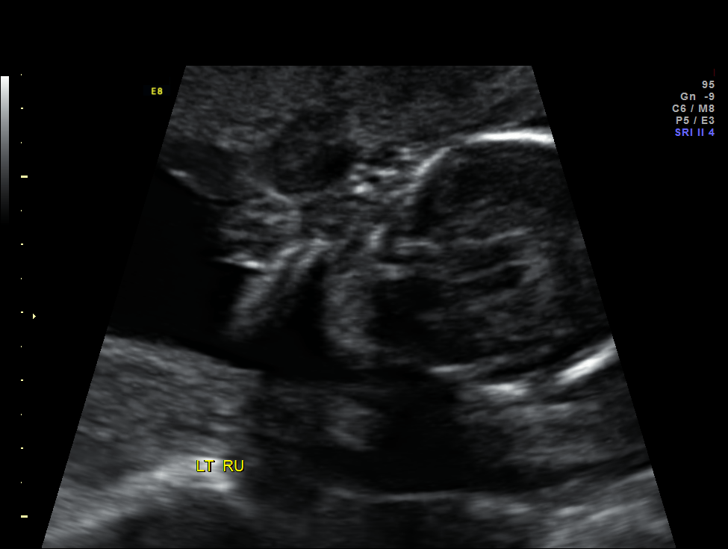
[im 66/94]
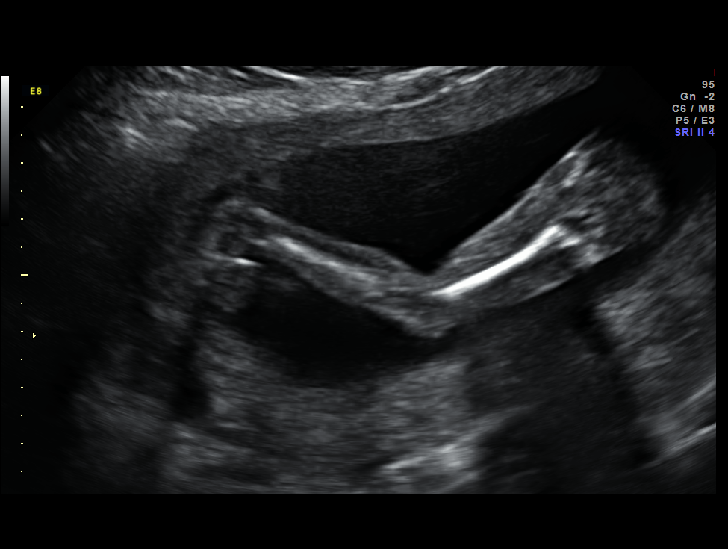
[im 73/94]
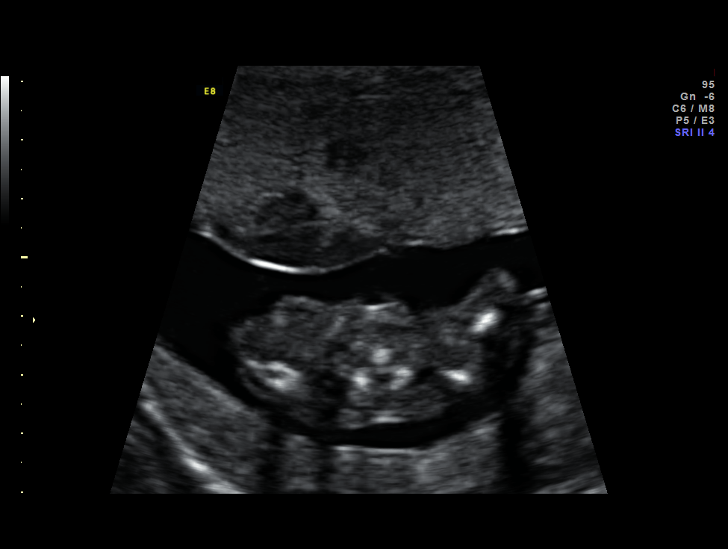
[im 80/94]
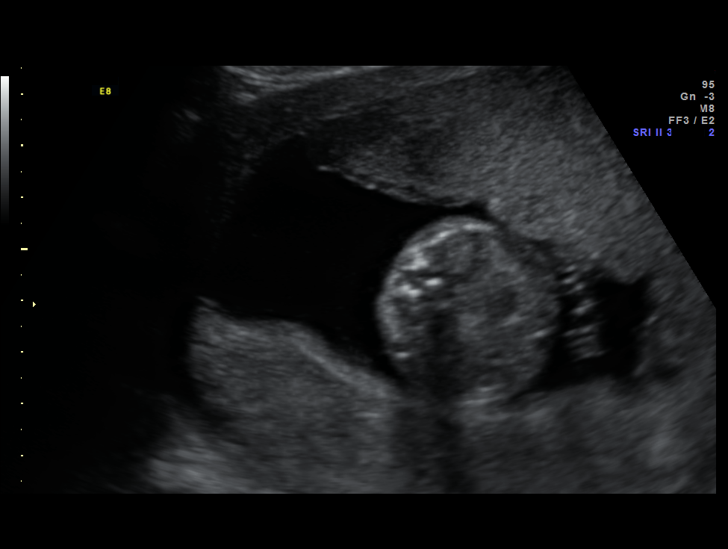
[im 87/94]
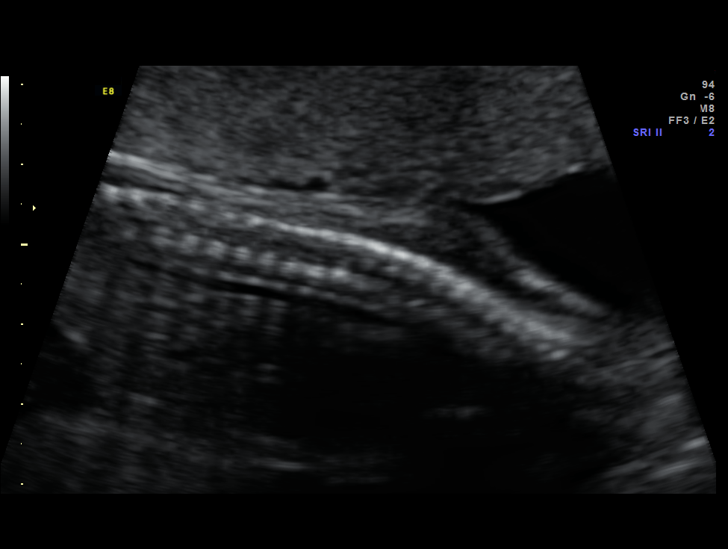
[im 94/94]
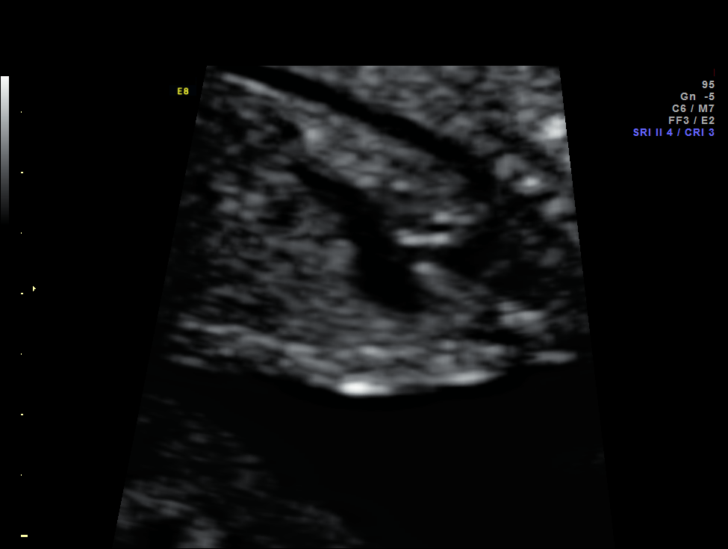

[14 of 28 positions shown; findings below may reference images not displayed]

Canned report from images found in remote index.

Refer to host system for actual result text.

## 2014-02-23 ENCOUNTER — Encounter (HOSPITAL_COMMUNITY): Payer: Self-pay | Admitting: Obstetrics and Gynecology

## 2014-06-15 ENCOUNTER — Other Ambulatory Visit: Payer: Self-pay | Admitting: Obstetrics and Gynecology

## 2014-06-16 LAB — CYTOLOGY - PAP

## 2017-10-17 ENCOUNTER — Ambulatory Visit (HOSPITAL_COMMUNITY)
Admission: RE | Admit: 2017-10-17 | Discharge: 2017-10-17 | Disposition: A | Discharge status: Home / Self Care | Payer: 59 | Source: Ambulatory Visit | Attending: Obstetrics and Gynecology | Admitting: Obstetrics and Gynecology

## 2017-10-17 ENCOUNTER — Other Ambulatory Visit (HOSPITAL_COMMUNITY): Payer: Self-pay | Admitting: Obstetrics and Gynecology

## 2017-10-17 DIAGNOSIS — I081 Rheumatic disorders of both mitral and tricuspid valves: Secondary | ICD-10-CM | POA: Diagnosis not present

## 2017-10-17 DIAGNOSIS — I5030 Unspecified diastolic (congestive) heart failure: Secondary | ICD-10-CM | POA: Diagnosis not present

## 2017-10-17 DIAGNOSIS — R011 Cardiac murmur, unspecified: Secondary | ICD-10-CM | POA: Diagnosis present

## 2017-10-17 NOTE — Progress Notes (Signed)
  Echocardiogram 2D Echocardiogram has been performed.  Beverly Chen, Beverly Chen 10/17/2017, 9:10 AM

## 2017-10-22 ENCOUNTER — Telehealth: Payer: Self-pay | Admitting: Cardiovascular Disease

## 2017-10-22 NOTE — Telephone Encounter (Signed)
Received records from [facility] on 10/22/17, Appt 10/31/17 @ 10:15AM. [place]

## 2017-10-31 ENCOUNTER — Encounter: Payer: Self-pay | Admitting: Cardiovascular Disease

## 2017-10-31 ENCOUNTER — Ambulatory Visit: Payer: 59 | Admitting: Cardiovascular Disease

## 2017-10-31 VITALS — BP 122/84 | HR 95 | Ht 66.0 in | Wt 138.0 lb

## 2017-10-31 DIAGNOSIS — R011 Cardiac murmur, unspecified: Secondary | ICD-10-CM | POA: Diagnosis not present

## 2017-10-31 DIAGNOSIS — I447 Left bundle-branch block, unspecified: Secondary | ICD-10-CM | POA: Diagnosis not present

## 2017-10-31 NOTE — Assessment & Plan Note (Signed)
She has not had a EKG to her knowledge before.  This is newly recognized but probably of no clinical significance.

## 2017-10-31 NOTE — Patient Instructions (Signed)
Medication Instructions: Your physician recommends that you continue on your current medications as directed. Please refer to the Current Medication list given to you today.  If you need a refill on your cardiac medications before your next appointment, please call your pharmacy.    Follow-Up: Your physician wants you to follow-up as needed with Dr. Berry.  Thank you for choosing [facility]!!      

## 2017-10-31 NOTE — Assessment & Plan Note (Signed)
Ms. Renelda LomaHorm was referred to me by Dr. Arelia SneddonMcComb for evaluation of a cardiac murmur which has been auscultated in the past.  Her echo showed low normal LV function with an EF in the 50 to 55% range with septal dyssynchrony consistent with left bundle branch block.  There is apical anteroseptal distal akinesis as well probably related to conduction.  The patient is completely asymptomatic.

## 2017-10-31 NOTE — Progress Notes (Signed)
10/31/2017 JAYNA MULNIX   Feb 13, 1976  [id number]  Primary Physician Richardean Chimera, MD Primary Cardiologist: Runell Gess MD Nicholes Calamity, [place]  HPI:  ALYXIS GRIPPI is a 42 y.o. thin and fit appearing married Caucasian female mother of 2 children who is accompanied by her husband Trey Paula today.  She was referred by Dr. Arelia Sneddon, her OB/GYN, for evaluation of a cardiac murmur.  She works at the [employer].  She has no cardiac risk factors.  She is never had a heart attack or stroke.  She denies chest pain or shortness of breath.  Her father did have a stent in her mother apparently had A. fib.  She had a murmur auscultated and had a 2D echo performed that showed septal dyssynergy and distal anteroseptal apical akinesia with an EF of 50 to 55%.  She also has a left bundle branch block but has not had an EKG before.   Current Meds  Medication Sig  . levothyroxine (SYNTHROID, LEVOTHROID) 88 MCG tablet Take 88 mcg by mouth daily.  . Multiple Vitamin (MULTIVITAMIN) tablet Take 1 tablet by mouth 2 (two) times daily.       No Known Allergies  Social History   Socioeconomic History  . Marital status: Married    Spouse name: Not on file  . Number of children: Not on file  . Years of education: Not on file  . Highest education level: Not on file  Occupational History  . Not on file  Social Needs  . Financial resource strain: Not on file  . Food insecurity:    Worry: Not on file    Inability: Not on file  . Transportation needs:    Medical: Not on file    Non-medical: Not on file  Tobacco Use  . Smoking status: Never Smoker  . Smokeless tobacco: Never Used  Substance and Sexual Activity  . Alcohol use: Yes    Comment: occasional  . Drug use: No  . Sexual activity: Not Currently  Lifestyle  . Physical activity:    Days per week: Not on file    Minutes per session: Not on file  . Stress: Not on file  Relationships  . Social connections:    Talks on phone: Not on  file    Gets together: Not on file    Attends religious service: Not on file    Active member of club or organization: Not on file    Attends meetings of clubs or organizations: Not on file    Relationship status: Not on file  . Intimate partner violence:    Fear of current or ex partner: Not on file    Emotionally abused: Not on file    Physically abused: Not on file    Forced sexual activity: Not on file  Other Topics Concern  . Not on file  Social History Narrative  . Not on file     Review of Systems: General: negative for chills, fever, night sweats or weight changes.  Cardiovascular: negative for chest pain, dyspnea on exertion, edema, orthopnea, palpitations, paroxysmal nocturnal dyspnea or shortness of breath Dermatological: negative for rash Respiratory: negative for cough or wheezing Urologic: negative for hematuria Abdominal: negative for nausea, vomiting, diarrhea, bright red blood per rectum, melena, or hematemesis Neurologic: negative for visual changes, syncope, or dizziness All other systems reviewed and are otherwise negative except as noted above.    Blood pressure 122/84, pulse 95, height 5\' 6"  (1.676 m),  weight 138 lb (62.6 kg).  General appearance: alert and no distress Neck: no adenopathy, no carotid bruit, no JVD, supple, symmetrical, trachea midline and thyroid not enlarged, symmetric, no tenderness/mass/nodules Lungs: clear to auscultation bilaterally Heart: Soft outflow tract murmur Extremities: extremities normal, atraumatic, no cyanosis or edema Pulses: 2+ and symmetric Skin: Skin color, texture, turgor normal. No rashes or lesions Neurologic: Alert and oriented X 3, normal strength and tone. Normal symmetric reflexes. Normal coordination and gait  EKG sinus rhythm at 95 with left bundle branch block.  I personally reviewed this EKG.  ASSESSMENT AND PLAN:   Cardiac murmur Ms. Paone was referred to me by Dr. Arelia SneddonMcComb for evaluation of a cardiac  murmur which has been auscultated in the past.  Her echo showed low normal LV function with an EF in the 50 to 55% range with septal dyssynchrony consistent with left bundle branch block.  There is apical anteroseptal distal akinesis as well probably related to conduction.  The patient is completely asymptomatic.  Left bundle branch block She has not had a EKG to her knowledge before.  This is newly recognized but probably of no clinical significance.      Runell GessJonathan J. Sherrilynn Gudgel MD FACP,FACC,FAHA, [facility] 10/31/2017 9:11 AM
